# Patient Record
Sex: Male | Born: 1955 | Race: Black or African American | Hispanic: No | Marital: Single | Smoking: Current every day smoker
Health system: Southern US, Community
[De-identification: ages and names within clinical notes are randomized; demographics above are authoritative.]

## PROBLEM LIST (undated history)

## (undated) HISTORY — PX: HEMORRHOID SURGERY: SHX153

---

## 2008-04-03 ENCOUNTER — Emergency Department (HOSPITAL_COMMUNITY): Admission: EM | Admit: 2008-04-03 | Discharge: 2008-04-03 | Payer: Self-pay | Admitting: Emergency Medicine

## 2009-01-06 ENCOUNTER — Emergency Department (HOSPITAL_COMMUNITY): Admission: EM | Admit: 2009-01-06 | Discharge: 2009-01-07 | Payer: Self-pay | Admitting: Emergency Medicine

## 2016-01-07 ENCOUNTER — Inpatient Hospital Stay (HOSPITAL_COMMUNITY)
Admission: EM | Admit: 2016-01-07 | Discharge: 2016-01-09 | DRG: 872 | Disposition: A | Discharge status: Home / Self Care | Payer: Self-pay | Attending: Internal Medicine | Admitting: Internal Medicine

## 2016-01-07 ENCOUNTER — Emergency Department (HOSPITAL_COMMUNITY): Payer: Self-pay

## 2016-01-07 ENCOUNTER — Encounter (HOSPITAL_COMMUNITY): Payer: Self-pay | Admitting: Emergency Medicine

## 2016-01-07 DIAGNOSIS — N390 Urinary tract infection, site not specified: Secondary | ICD-10-CM | POA: Diagnosis present

## 2016-01-07 DIAGNOSIS — F1010 Alcohol abuse, uncomplicated: Secondary | ICD-10-CM | POA: Diagnosis present

## 2016-01-07 DIAGNOSIS — B9620 Unspecified Escherichia coli [E. coli] as the cause of diseases classified elsewhere: Secondary | ICD-10-CM | POA: Diagnosis present

## 2016-01-07 DIAGNOSIS — F17210 Nicotine dependence, cigarettes, uncomplicated: Secondary | ICD-10-CM | POA: Diagnosis present

## 2016-01-07 DIAGNOSIS — E861 Hypovolemia: Secondary | ICD-10-CM | POA: Diagnosis present

## 2016-01-07 DIAGNOSIS — E871 Hypo-osmolality and hyponatremia: Secondary | ICD-10-CM | POA: Diagnosis present

## 2016-01-07 DIAGNOSIS — A419 Sepsis, unspecified organism: Principal | ICD-10-CM | POA: Diagnosis present

## 2016-01-07 LAB — CBC WITH DIFFERENTIAL/PLATELET
BASOS ABS: 0 10*3/uL (ref 0.0–0.1)
Basophils Relative: 1 %
EOS ABS: 0 10*3/uL (ref 0.0–0.7)
EOS PCT: 0 %
HCT: 45.3 % (ref 39.0–52.0)
Hemoglobin: 16.1 g/dL (ref 13.0–17.0)
LYMPHS ABS: 0.5 10*3/uL — AB (ref 0.7–4.0)
LYMPHS PCT: 13 %
MCH: 33 pg (ref 26.0–34.0)
MCHC: 35.5 g/dL (ref 30.0–36.0)
MCV: 92.8 fL (ref 78.0–100.0)
MONO ABS: 0.1 10*3/uL (ref 0.1–1.0)
Monocytes Relative: 2 %
Neutro Abs: 3.4 10*3/uL (ref 1.7–7.7)
Neutrophils Relative %: 84 %
PLATELETS: 115 10*3/uL — AB (ref 150–400)
RBC: 4.88 MIL/uL (ref 4.22–5.81)
RDW: 13 % (ref 11.5–15.5)
WBC: 4.1 10*3/uL (ref 4.0–10.5)

## 2016-01-07 LAB — I-STAT CG4 LACTIC ACID, ED
LACTIC ACID, VENOUS: 1.63 mmol/L (ref 0.5–1.9)
Lactic Acid, Venous: 3.26 mmol/L (ref 0.5–1.9)

## 2016-01-07 LAB — URINE MICROSCOPIC-ADD ON

## 2016-01-07 LAB — COMPREHENSIVE METABOLIC PANEL
ALT: 15 U/L — ABNORMAL LOW (ref 17–63)
AST: 31 U/L (ref 15–41)
Albumin: 3.6 g/dL (ref 3.5–5.0)
Alkaline Phosphatase: 129 U/L — ABNORMAL HIGH (ref 38–126)
Anion gap: 15 (ref 5–15)
BILIRUBIN TOTAL: 0.9 mg/dL (ref 0.3–1.2)
BUN: 9 mg/dL (ref 6–20)
CHLORIDE: 96 mmol/L — AB (ref 101–111)
CO2: 17 mmol/L — ABNORMAL LOW (ref 22–32)
Calcium: 9.2 mg/dL (ref 8.9–10.3)
Creatinine, Ser: 1.02 mg/dL (ref 0.61–1.24)
Glucose, Bld: 93 mg/dL (ref 65–99)
POTASSIUM: 3.7 mmol/L (ref 3.5–5.1)
Sodium: 128 mmol/L — ABNORMAL LOW (ref 135–145)
TOTAL PROTEIN: 7.6 g/dL (ref 6.5–8.1)

## 2016-01-07 LAB — URINALYSIS, ROUTINE W REFLEX MICROSCOPIC
Bilirubin Urine: NEGATIVE
GLUCOSE, UA: NEGATIVE mg/dL
KETONES UR: NEGATIVE mg/dL
Nitrite: NEGATIVE
PROTEIN: NEGATIVE mg/dL
Specific Gravity, Urine: 1.009 (ref 1.005–1.030)
pH: 5.5 (ref 5.0–8.0)

## 2016-01-07 LAB — I-STAT BETA HCG BLOOD, ED (MC, WL, AP ONLY)

## 2016-01-07 LAB — CBG MONITORING, ED: Glucose-Capillary: 95 mg/dL (ref 65–99)

## 2016-01-07 LAB — PROCALCITONIN: Procalcitonin: 0.4 ng/mL

## 2016-01-07 MED ORDER — ACETAMINOPHEN 325 MG PO TABS: 650.0000 mg | ORAL_TABLET | Freq: Once | ORAL | Status: DC | PRN

## 2016-01-07 MED ORDER — VANCOMYCIN HCL IN DEXTROSE 750-5 MG/150ML-% IV SOLN
750.0000 mg | Freq: Two times a day (BID) | INTRAVENOUS | Status: DC
  Administered 2016-01-08 – 2016-01-09 (×3): 750 mg via INTRAVENOUS
  Filled 2016-01-07 (×3): qty 150

## 2016-01-07 MED ORDER — PIPERACILLIN-TAZOBACTAM 3.375 G IVPB
3.3750 g | Freq: Three times a day (TID) | INTRAVENOUS | Status: DC
  Administered 2016-01-08 – 2016-01-09 (×4): 3.375 g via INTRAVENOUS
  Filled 2016-01-07 (×4): qty 50

## 2016-01-07 MED ORDER — NICOTINE 21 MG/24HR TD PT24
21.0000 mg | MEDICATED_PATCH | Freq: Every day | TRANSDERMAL | Status: DC
  Administered 2016-01-08 – 2016-01-09 (×2): 21 mg via TRANSDERMAL
  Filled 2016-01-07 (×2): qty 1

## 2016-01-07 MED ORDER — ACETAMINOPHEN 325 MG PO TABS
650.0000 mg | ORAL_TABLET | Freq: Once | ORAL | Status: AC
  Administered 2016-01-07: 650 mg via ORAL
  Filled 2016-01-07: qty 2

## 2016-01-07 MED ORDER — VANCOMYCIN HCL IN DEXTROSE 1-5 GM/200ML-% IV SOLN
1000.0000 mg | Freq: Once | INTRAVENOUS | Status: AC
  Administered 2016-01-07: 1000 mg via INTRAVENOUS
  Filled 2016-01-07: qty 200

## 2016-01-07 MED ORDER — PIPERACILLIN-TAZOBACTAM 3.375 G IVPB 30 MIN
3.3750 g | Freq: Once | INTRAVENOUS | Status: AC
  Administered 2016-01-07: 3.375 g via INTRAVENOUS
  Filled 2016-01-07: qty 50

## 2016-01-07 MED ORDER — SODIUM CHLORIDE 0.9 % IV BOLUS (SEPSIS)
2000.0000 mL | Freq: Once | INTRAVENOUS | Status: AC
  Administered 2016-01-07: 2000 mL via INTRAVENOUS

## 2016-01-07 NOTE — H&P (Signed)
History and Physical    Ethan Donovan XKG:[id number] DOB: 11/23/1955 DOA: 01/07/2016  PCP: No primary care provider on file. Consultants:  None Patient coming from: home - lives with sister; NOK: Tamsen Roers, daughter, [phone]  Chief Complaint: shaking  HPI: Ethan Donovan is a 60 y.o. male with no significant medical history.  Patient reports that he "just started shaking, couldn't stop shaking."  Started about 1700 tonight.  Felt fine earlier in the day but was "sick" earlier in the week.  He had mild shakes and vomiting Wednesday and Thursday, then felt better.  Did not check temp at home.  Slight runny nose on Wednesday, has continued.  No urinary symptoms.   ED Course: Per PA-C [place]: Ethan Donovan is 60 y.o. male presenting with Lightheaded sensation and what sounds like rigors. Only other symptom is mild rhinorrhea. Patient febrile with a temperature of 102, mild tachycardia, patient with no real symptoms aside from rhinorrhea. Overall well-appearing.  Lactic acid is elevated, initiated vancomycin and Zosyn and patient is given 3 L fluid bolus.  Chest x-ray negative, urinalysis is consistent with infection, urine culture is pending. No CVA tenderness to percussion to suggest a pyelonephritis. Repeat abdominal exam is benign. Case discussed with Dr. Ophelia Charter who accepts admission.  Review of Systems: As per HPI; otherwise 10 point review of systems reviewed and negative.   Ambulatory Status:  Ambulates without assistance, recently fractured akle and no longer using crutch  History reviewed. No pertinent past medical history.  Past Surgical History:  Procedure Laterality Date  . HEMORRHOID SURGERY      Social History   Social History  . Marital status: Single    Spouse name: N/A  . Number of children: N/A  . Years of education: N/A   Occupational History  . unemployed    Social History Main Topics  . Smoking status: Current Every Day Smoker    Packs/day:  1.00    Years: 30.00    Types: Cigarettes  . Smokeless tobacco: Never Used  . Alcohol use 3.6 - 7.2 oz/week    6 - 12 Cans of beer per week     Comment: "too much"  . Drug use:     Types: Marijuana     Comment: last use yesterday  . Sexual activity: Not on file   Other Topics Concern  . Not on file   Social History Narrative  . No narrative on file    No Known Allergies  Family History  Problem Relation Age of Onset  . Stevens-Johnson syndrome Mother   . Pulmonary embolism Father     Prior to Admission medications   Not on File    Physical Exam: Vitals:   01/07/16 2141 01/07/16 2155 01/07/16 2230 01/08/16 0017  BP:  116/69 113/71 110/85  Pulse:  95 90 85  Resp:  17 16 16   Temp: 99.5 F (37.5 C)   99.4 F (37.4 C)  TempSrc: Oral   Oral  SpO2:  96% 100% 98%  Weight:    66.8 kg (147 lb 4.3 oz)  Height:    6' (1.829 m)     General: Appears calm and comfortable and is NAD Eyes:  PERRL, EOMI, normal lids, iris ENT:  grossly normal hearing, lips & tongue, mmm Neck:  no LAD, masses or thyromegaly Cardiovascular:  RRR, no m/r/g. No LE edema.  Respiratory:  CTA bilaterally, no w/r/r. Normal respiratory effort. Abdomen:  soft, ntnd, NABS Skin:  no rash or induration seen on  limited exam Musculoskeletal:  grossly normal tone BUE/BLE, good ROM, no bony abnormality Psychiatric:  grossly normal mood and affect, speech fluent and appropriate, AOx3 Neurologic:  CN 2-12 grossly intact, moves all extremities in coordinated fashion, sensation intact  Labs on Admission: I have personally reviewed following labs and imaging studies  CBC:  Recent Labs Lab 01/07/16 1833  WBC 4.1  NEUTROABS 3.4  HGB 16.1  HCT 45.3  MCV 92.8  PLT 115*   Basic Metabolic Panel:  Recent Labs Lab 01/07/16 1833  NA 128*  K 3.7  CL 96*  CO2 17*  GLUCOSE 93  BUN 9  CREATININE 1.02  CALCIUM 9.2   GFR: Estimated Creatinine Clearance: 74.6 mL/min (by C-G formula based on SCr of  1.02 mg/dL). Liver Function Tests:  Recent Labs Lab 01/07/16 1833  AST 31  ALT 15*  ALKPHOS 129*  BILITOT 0.9  PROT 7.6  ALBUMIN 3.6   No results for input(s): LIPASE, AMYLASE in the last 168 hours. No results for input(s): AMMONIA in the last 168 hours. Coagulation Profile: No results for input(s): INR, PROTIME in the last 168 hours. Cardiac Enzymes: No results for input(s): CKTOTAL, CKMB, CKMBINDEX, TROPONINI in the last 168 hours. BNP (last 3 results) No results for input(s): PROBNP in the last 8760 hours. HbA1C: No results for input(s): HGBA1C in the last 72 hours. CBG:  Recent Labs Lab 01/07/16 1841  GLUCAP 95   Lipid Profile: No results for input(s): CHOL, HDL, LDLCALC, TRIG, CHOLHDL, LDLDIRECT in the last 72 hours. Thyroid Function Tests: No results for input(s): TSH, T4TOTAL, FREET4, T3FREE, THYROIDAB in the last 72 hours. Anemia Panel: No results for input(s): VITAMINB12, FOLATE, FERRITIN, TIBC, IRON, RETICCTPCT in the last 72 hours. Urine analysis:    Component Value Date/Time   COLORURINE YELLOW 01/07/2016 2042   APPEARANCEUR CLOUDY (A) 01/07/2016 2042   LABSPEC 1.009 01/07/2016 2042   PHURINE 5.5 01/07/2016 2042   GLUCOSEU NEGATIVE 01/07/2016 2042   HGBUR MODERATE (A) 01/07/2016 2042   BILIRUBINUR NEGATIVE 01/07/2016 2042   KETONESUR NEGATIVE 01/07/2016 2042   PROTEINUR NEGATIVE 01/07/2016 2042   NITRITE NEGATIVE 01/07/2016 2042   LEUKOCYTESUR LARGE (A) 01/07/2016 2042    Creatinine Clearance: Estimated Creatinine Clearance: 74.6 mL/min (by C-G formula based on SCr of 1.02 mg/dL).  Sepsis Labs: @LABRCNTIP (procalcitonin:4,lacticidven:4) )No results found for this or any previous visit (from the past 240 hour(s)).   Radiological Exams on Admission: Dg Chest 2 View  Result Date: 01/07/2016 CLINICAL DATA:  Dizziness after moving furniture at 10 a.m. this morning. Chills started at 5:30 p.m. EXAM: CHEST  2 VIEW COMPARISON:  None. FINDINGS: Lungs are  hyperexpanded consistent with emphysema. No dense focal airspace consolidation. No pulmonary edema or pleural effusion. Streaky opacity in the right base is likely atelectatic or related to scarring. The cardiopericardial silhouette is within normal limits for size. Anterior right first rib not well visualize, likely positional. Telemetry leads overlie the chest. IMPRESSION: Emphysema with probable atelectasis or scarring at the right base. Electronically Signed   By: Kennith Center M.D.   On: 01/07/2016 19:46    EKG: Independently reviewed.  Sinus tachycardia with rate 109; nonspecific ST changes with no evidence of acute ischemia (computer interpretation included "ST elevation" but EKG was reviewed with Dr. Virgina Organ who agrees that there is no concern for STEMI in this patient)  Assessment/Plan Principal Problem:   Sepsis secondary to UTI [facility]) Active Problems:   Hyponatremia   Sepsis, likely urinary source -Fever, tachycardia with  elevated lactate to 3.26  -Sepsis protocol initiated -Suspect urinary source - large LE, manay bacteria, TNTC WBCs -Blood and urine cultures pending -Will place in observation status and continue to monitor -Treat with IV Rocephin -Will trend lactate to ensure improvement -Despite concern for sepsis, patient appears clinically quite well -His rigors may indicate a gram negative bacteremia, as this is a common presentation  Hyponatremia -Likely hypovolemic related to sepsis -Will follow  DVT prophylaxis: Lovenox Code Status:  Full - confirmed with patient/family Family Communication: Several family members present at bedside  Disposition Plan:  Home once clinically improved Consults called: None  Admission status: Observation    Jonah BlueJennifer Sharyl Panchal MD [facility]  If 7PM-7AM, please contact night-coverage [email or web address] Password [email or web address]  01/08/2016, 1:05 AM

## 2016-01-07 NOTE — ED Triage Notes (Signed)
Pt complaint of dizziness post moving furniture 1000 today. Pt continues to report chills onset 1730 today. Pt denies other symptoms or pain.

## 2016-01-07 NOTE — Progress Notes (Signed)
Pharmacy Antibiotic Follow-up Note  Lillette BoxerBobby Miltner is a 60 y.o. year-old male admitted on 01/07/2016.  The patient is currently on day 1 of Vancomycin & Zosyn for rule out sepsis.  Assessment/Plan: Vancomycin 1gm x1, then 750mg   IV every 12 hours.  Goal trough 15-20 mcg/mL. Zosyn 3.375g IV q8h (4 hour infusion).  Temp (24hrs), Avg:102.8 F (39.3 C), Min:102.8 F (39.3 C), Max:102.8 F (39.3 C)  No results for input(s): WBC in the last 168 hours.  Invalid input(s):  CREATININE  Recent Labs Lab 01/07/16 1833  CREATININE 1.02   Estimated Creatinine Clearance: 70.9 mL/min (by C-G formula based on SCr of 1.02 mg/dL).    No Known Allergies  Antimicrobials this admission: 9/8 Vancomycin  >>  9/8 Zosyn >>   Levels/dose changes this admission:  Microbiology results: 9/8 BCx: sent   Thank you for allowing pharmacy to be a part of this patient's care.  Otho BellowsGreen, Tishawn Friedhoff L PharmD Pager [phone] 01/07/2016, 7:44 PM

## 2016-01-07 NOTE — ED Provider Notes (Signed)
[facility]-EMERGENCY DEPT Provider Note   CSN: [id number] Arrival date & time: 01/07/16  1757     History   Chief Complaint Chief Complaint  Patient presents with  . Fever  . Dizziness    HPI  Blood pressure 120/83, pulse 111, temperature 102.8 F (39.3 C), temperature source Oral, resp. rate 18, height 6' (1.829 m), weight 63.5 kg, SpO2 95 %.  Ethan Donovan is a 60 y.o. male with no significant past medical history, not followed by by primary care complaining of lightheadedness onset this afternoon is patient was cleaning up the yard and power washing the driveway. He felt dizzy and had to sit down, he also had an episode of shaking, states he couldn't drink his beer because he was shaking. States he drinks beer daily, "whenever he can get it" He's never had any DTs or seizures from alcohol withdrawal. He denies cough, shortness of breath, chest pain, abdominal pain, nausea, vomiting, change in bowel or bladder habits, headache, sore throat, stiff neck, tick bites, muscle aches, rash, Flank pain, dysuria, hematuria, testicular pain, swelling, pain with defecation.. On review of systems he endorses rhinorrhea.  HPI  History reviewed. No pertinent past medical history.  Patient Active Problem List   Diagnosis Date Noted  . Sepsis secondary to UTI (HCC) 01/07/2016    Past Surgical History:  Procedure Laterality Date  . ANKLE SURGERY         Home Medications    Prior to Admission medications   Not on File    Family History No family history on file.  Social History Social History  Substance Use Topics  . Smoking status: Current Every Day Smoker    Packs/day: 1.00    Types: Cigarettes  . Smokeless tobacco: Not on file  . Alcohol use Yes     Allergies   Review of patient's allergies indicates no known allergies.   Review of Systems Review of Systems  10 systems reviewed and found to be negative, except as noted in the HPI.   Physical Exam Updated Vital  Signs BP 116/69   Pulse 97   Temp 99.5 F (37.5 C) (Oral)   Resp 16   Ht 6' (1.829 m)   Wt 63.5 kg   SpO2 96%   BMI 18.99 kg/m   Physical Exam  Constitutional: He is oriented to person, place, and time. He appears well-developed and well-nourished.  HENT:  Head: Normocephalic and atraumatic.  Mouth/Throat: Oropharynx is clear and moist.  Eyes: Conjunctivae and EOM are normal. Pupils are equal, round, and reactive to light.  No TTP of maxillary or frontal sinuses  No TTP or induration of temporal arteries bilaterally  Neck: Normal range of motion. Neck supple.  FROM to C-spine. Pt can touch chin to chest without discomfort. No TTP of midline cervical spine.   Cardiovascular: Regular rhythm and intact distal pulses.   Mild tachycardia with no murmur  Pulmonary/Chest: Effort normal and breath sounds normal. No respiratory distress. He has no wheezes. He has no rales. He exhibits no tenderness.  Abdominal: Soft. Bowel sounds are normal. He exhibits no distension and no mass. There is no tenderness. There is no rebound and no guarding. No hernia.  Genitourinary:  Genitourinary Comments: No CVA tenderness to percussion  Musculoskeletal: Normal range of motion. He exhibits no edema or tenderness.  Neurological: He is alert and oriented to person, place, and time. No cranial nerve deficit.  II-Visual fields grossly intact. III/IV/VI-Extraocular movements intact.  Pupils reactive bilaterally. V/VII-Smile  symmetric, equal eyebrow raise,  facial sensation intact VIII- Hearing grossly intact IX/X-Normal gag XI-bilateral shoulder shrug XII-midline tongue extension Motor: 5/5 bilaterally with normal tone and bulk Cerebellar: Normal finger-to-nose  and normal heel-to-shin test.   Romberg negative Ambulates with a coordinated gait   Skin: Capillary refill takes less than 2 seconds.  Warm to the touch  Nursing note and vitals reviewed.    ED Treatments / Results  Labs (all labs  ordered are listed, but only abnormal results are displayed) Labs Reviewed  COMPREHENSIVE METABOLIC PANEL - Abnormal; Notable for the following:       Result Value   Sodium 128 (*)    Chloride 96 (*)    CO2 17 (*)    ALT 15 (*)    Alkaline Phosphatase 129 (*)    All other components within normal limits  CBC WITH DIFFERENTIAL/PLATELET - Abnormal; Notable for the following:    Platelets 115 (*)    Lymphs Abs 0.5 (*)    All other components within normal limits  URINALYSIS, ROUTINE W REFLEX MICROSCOPIC (NOT [facility]) - Abnormal; Notable for the following:    APPearance CLOUDY (*)    Hgb urine dipstick MODERATE (*)    Leukocytes, UA LARGE (*)    All other components within normal limits  URINE MICROSCOPIC-ADD ON - Abnormal; Notable for the following:    Squamous Epithelial / LPF 0-5 (*)    Bacteria, UA MANY (*)    All other components within normal limits  I-STAT CG4 LACTIC ACID, ED - Abnormal; Notable for the following:    Lactic Acid, Venous 3.26 (*)    All other components within normal limits  CULTURE, BLOOD (ROUTINE X 2)  CULTURE, BLOOD (ROUTINE X 2)  URINE CULTURE  PROCALCITONIN  I-STAT BETA HCG BLOOD, ED ([facility], [facility], [facility] ONLY)  CBG MONITORING, ED  I-STAT CG4 LACTIC ACID, ED    EKG  EKG Interpretation  Date/Time:  Friday January 07 2016 18:05:17 EDT Ventricular Rate:  125 PR Interval:    QRS Duration: 87 QT Interval:  303 QTC Calculation: 437 R Axis:   -89 Text Interpretation:  Sinus tachycardia Probable left atrial enlargement Left anterior fascicular block Probable left ventricular hypertrophy Anterior Q waves, possibly due to LVH Baseline wander in lead(s) V5 No old tracing to compare Confirmed by Ethelda ChickJACUBOWITZ  MD, SAM [phone]) on 01/07/2016 6:12:34 PM       Radiology Dg Chest 2 View  Result Date: 01/07/2016 CLINICAL DATA:  Dizziness after moving furniture at 10 a.m. this morning. Chills started at 5:30 p.m. EXAM: CHEST  2 VIEW COMPARISON:  None. FINDINGS: Lungs are  hyperexpanded consistent with emphysema. No dense focal airspace consolidation. No pulmonary edema or pleural effusion. Streaky opacity in the right base is likely atelectatic or related to scarring. The cardiopericardial silhouette is within normal limits for size. Anterior right first rib not well visualize, likely positional. Telemetry leads overlie the chest. IMPRESSION: Emphysema with probable atelectasis or scarring at the right base. Electronically Signed   By: Kennith CenterEric  Mansell M.D.   On: 01/07/2016 19:46    Procedures Procedures (including critical care time)  Medications Ordered in ED Medications  acetaminophen (TYLENOL) tablet 650 mg (not administered)  vancomycin (VANCOCIN) IVPB 1000 mg/200 mL premix (1,000 mg Intravenous New Bag/Given 01/07/16 2134)  vancomycin (VANCOCIN) IVPB 750 mg/150 ml premix (not administered)  piperacillin-tazobactam (ZOSYN) IVPB 3.375 g (not administered)  acetaminophen (TYLENOL) tablet 650 mg (650 mg Oral Given 01/07/16 1856)  sodium chloride  0.9 % bolus 2,000 mL (0 mLs Intravenous Stopped 01/07/16 2120)  piperacillin-tazobactam (ZOSYN) IVPB 3.375 g (0 g Intravenous Stopped 01/07/16 2141)     Initial Impression / Assessment and Plan / ED Course  I have reviewed the triage vital signs and the nursing notes.  Pertinent labs & imaging results that were available during my care of the patient were reviewed by me and considered in my medical decision making (see chart for details).  Clinical Course    Vitals:   01/07/16 2100 01/07/16 2111 01/07/16 2130 01/07/16 2141  BP: 122/73 122/70 116/69   Pulse: 92 94 97   Resp:  16    Temp:    99.5 F (37.5 C)  TempSrc:    Oral  SpO2: 100% 100% 96%   Weight:      Height:        Medications  acetaminophen (TYLENOL) tablet 650 mg (not administered)  vancomycin (VANCOCIN) IVPB 1000 mg/200 mL premix (1,000 mg Intravenous New Bag/Given 01/07/16 2134)  vancomycin (VANCOCIN) IVPB 750 mg/150 ml premix (not administered)    piperacillin-tazobactam (ZOSYN) IVPB 3.375 g (not administered)  acetaminophen (TYLENOL) tablet 650 mg (650 mg Oral Given 01/07/16 1856)  sodium chloride 0.9 % bolus 2,000 mL (0 mLs Intravenous Stopped 01/07/16 2120)  piperacillin-tazobactam (ZOSYN) IVPB 3.375 g (0 g Intravenous Stopped 01/07/16 2141)    Ethan Donovan is 60 y.o. male presenting with Lightheaded sensation and what sounds like rigors. Only other symptom is mild rhinorrhea. Patient febrile with a temperature of 102, mild tachycardia, patient with no real symptoms aside from rhinorrhea. Overall well-appearing.  Lactic acid is elevated, initiated vancomycin and Zosyn and patient is given 3 L fluid bolus.  Chest x-ray negative, urinalysis is consistent with infection, urine culture is pending. No CVA tenderness to percussion to suggest a pyelonephritis. Repeat abdominal exam is benign. Case discussed with Dr. Ophelia Charter who accepts admission.      Final Clinical Impressions(s) / ED Diagnoses   Final diagnoses:  Sepsis secondary to UTI [facility])      Wynetta Emery, PA-C 01/07/16 2152    Lavera Guise, MD 01/08/16 [phone]

## 2016-01-07 NOTE — ED Notes (Signed)
PA given lactic results.

## 2016-01-07 NOTE — ED Notes (Signed)
Patient comes from home with fever that began this afternoon.  Patient felt unwell on Tuesday of this week, with N/V, but that resolved spontaneously.  On exam, patient's lung sounds are CTAB.  Heart sounds S1/S2.  +2 radial and pedal pulses.  No pre-tibial and pedal edema.  Patient's abdomen is soft and non-tender to palpation.  Bowel sounds present.  Patient is alert and oriented to person, place, time and events. Skin warm/dry.

## 2016-01-08 DIAGNOSIS — E871 Hypo-osmolality and hyponatremia: Secondary | ICD-10-CM | POA: Diagnosis present

## 2016-01-08 DIAGNOSIS — A419 Sepsis, unspecified organism: Principal | ICD-10-CM

## 2016-01-08 DIAGNOSIS — N390 Urinary tract infection, site not specified: Secondary | ICD-10-CM

## 2016-01-08 LAB — PROTIME-INR
INR: 0.95
Prothrombin Time: 12.7 seconds (ref 11.4–15.2)

## 2016-01-08 LAB — LACTIC ACID, PLASMA
LACTIC ACID, VENOUS: 0.7 mmol/L (ref 0.5–1.9)
Lactic Acid, Venous: 1.3 mmol/L (ref 0.5–1.9)

## 2016-01-08 LAB — APTT: aPTT: 30 seconds (ref 24–36)

## 2016-01-08 MED ORDER — ADULT MULTIVITAMIN W/MINERALS CH
1.0000 | ORAL_TABLET | Freq: Every day | ORAL | Status: DC
  Administered 2016-01-08 – 2016-01-09 (×2): 1 via ORAL
  Filled 2016-01-08 (×2): qty 1

## 2016-01-08 MED ORDER — LORAZEPAM 1 MG PO TABS
1.0000 mg | ORAL_TABLET | Freq: Four times a day (QID) | ORAL | Status: DC | PRN
Start: 2016-01-08 — End: 2016-01-09

## 2016-01-08 MED ORDER — LORAZEPAM 2 MG/ML IJ SOLN: 1.0000 mg | Freq: Four times a day (QID) | INTRAMUSCULAR | Status: DC | PRN

## 2016-01-08 MED ORDER — THIAMINE HCL 100 MG/ML IJ SOLN: 100.0000 mg | Freq: Every day | INTRAMUSCULAR | Status: DC

## 2016-01-08 MED ORDER — FOLIC ACID 1 MG PO TABS
1.0000 mg | ORAL_TABLET | Freq: Every day | ORAL | Status: DC
  Administered 2016-01-08 – 2016-01-09 (×2): 1 mg via ORAL
  Filled 2016-01-08 (×2): qty 1

## 2016-01-08 MED ORDER — VITAMIN B-1 100 MG PO TABS
100.0000 mg | ORAL_TABLET | Freq: Every day | ORAL | Status: DC
  Administered 2016-01-08 – 2016-01-09 (×2): 100 mg via ORAL
  Filled 2016-01-08 (×2): qty 1

## 2016-01-08 NOTE — Progress Notes (Signed)
PROGRESS NOTE    Ethan Donovan  RUE:[id number] DOB: 1955/06/24 DOA: 01/07/2016 PCP: No primary care provider on file.   Outpatient Specialists:     Brief Narrative:     Assessment & Plan:   Principal Problem:   Sepsis secondary to UTI [facility]) Active Problems:   Hyponatremia   Sepsis, likely urinary source -Fever, tachycardia with elevated lactate to 3.26  -Sepsis protocol initiated -Suspect urinary source - large LE, manay bacteria, TNTC WBCs -Blood and urine cultures pending -on IV zosyn -Will trend lactate to ensure improvement -Despite concern for sepsis, patient appears clinically quite well  -much improved this AM  Hyponatremia -Likely hypovolemic related to sepsis -Will follow    DVT prophylaxis:  SCD's  Code Status: Full Code   Family Communication: patient  Disposition Plan:  Home 1-2 days   Consultants:     Procedures:        Subjective: Feeling much better-- asking about going home  Objective: Vitals:   01/07/16 2230 01/08/16 0017 01/08/16 0626 01/08/16 1303  BP: 113/71 110/85 120/88 (!) 149/92  Pulse: 90 85 75 72  Resp: 16 16 18 16   Temp:  99.4 F (37.4 C) 99.2 F (37.3 C) 99.1 F (37.3 C)  TempSrc:  Oral Oral Oral  SpO2: 100% 98% 99% 99%  Weight:  66.8 kg (147 lb 4.3 oz)    Height:  6' (1.829 m)      Intake/Output Summary (Last 24 hours) at 01/08/16 1323 Last data filed at 01/08/16 1303  Gross per 24 hour  Intake              875 ml  Output                0 ml  Net              875 ml   Filed Weights   01/07/16 1808 01/08/16 0017  Weight: 63.5 kg (140 lb) 66.8 kg (147 lb 4.3 oz)    Examination:  General exam: Appears calm and comfortable  Respiratory system: Clear to auscultation. Respiratory effort normal. Cardiovascular system: S1 & S2 heard, RRR. No JVD, murmurs, rubs, gallops or clicks. No pedal edema. Gastrointestinal system: Abdomen is nondistended, soft and nontender. No organomegaly or masses felt.  Normal bowel sounds heard. Central nervous system: Alert and oriented. No focal neurological deficits. Extremities: Symmetric 5 x 5 power. Skin: No rashes, lesions or ulcers Psychiatry: Judgement and insight appear normal. Mood & affect appropriate.     Data Reviewed: I have personally reviewed following labs and imaging studies  CBC:  Recent Labs Lab 01/07/16 1833  WBC 4.1  NEUTROABS 3.4  HGB 16.1  HCT 45.3  MCV 92.8  PLT 115*   Basic Metabolic Panel:  Recent Labs Lab 01/07/16 1833  NA 128*  K 3.7  CL 96*  CO2 17*  GLUCOSE 93  BUN 9  CREATININE 1.02  CALCIUM 9.2   GFR: Estimated Creatinine Clearance: 74.6 mL/min (by C-G formula based on SCr of 1.02 mg/dL). Liver Function Tests:  Recent Labs Lab 01/07/16 1833  AST 31  ALT 15*  ALKPHOS 129*  BILITOT 0.9  PROT 7.6  ALBUMIN 3.6   No results for input(s): LIPASE, AMYLASE in the last 168 hours. No results for input(s): AMMONIA in the last 168 hours. Coagulation Profile:  Recent Labs Lab 01/08/16 0754  INR 0.95   Cardiac Enzymes: No results for input(s): CKTOTAL, CKMB, CKMBINDEX, TROPONINI in the last 168 hours. BNP (last 3  results) No results for input(s): PROBNP in the last 8760 hours. HbA1C: No results for input(s): HGBA1C in the last 72 hours. CBG:  Recent Labs Lab 01/07/16 1841  GLUCAP 95   Lipid Profile: No results for input(s): CHOL, HDL, LDLCALC, TRIG, CHOLHDL, LDLDIRECT in the last 72 hours. Thyroid Function Tests: No results for input(s): TSH, T4TOTAL, FREET4, T3FREE, THYROIDAB in the last 72 hours. Anemia Panel: No results for input(s): VITAMINB12, FOLATE, FERRITIN, TIBC, IRON, RETICCTPCT in the last 72 hours. Urine analysis:    Component Value Date/Time   COLORURINE YELLOW 01/07/2016 2042   APPEARANCEUR CLOUDY (A) 01/07/2016 2042   LABSPEC 1.009 01/07/2016 2042   PHURINE 5.5 01/07/2016 2042   GLUCOSEU NEGATIVE 01/07/2016 2042   HGBUR MODERATE (A) 01/07/2016 2042    BILIRUBINUR NEGATIVE 01/07/2016 2042   KETONESUR NEGATIVE 01/07/2016 2042   PROTEINUR NEGATIVE 01/07/2016 2042   NITRITE NEGATIVE 01/07/2016 2042   LEUKOCYTESUR LARGE (A) 01/07/2016 2042     ) Recent Results (from the past 240 hour(s))  Culture, blood (Routine x 2)     Status: None (Preliminary result)   Collection Time: 01/07/16  6:19 PM  Result Value Ref Range Status   Specimen Description BLOOD BLOOD RIGHT ARM  Final   Special Requests BOTTLES DRAWN AEROBIC AND ANAEROBIC 5CC  Final   Culture   Final    NO GROWTH < 12 HOURS Performed at [facility]    Report Status PENDING  Incomplete  Culture, blood (Routine x 2)     Status: None (Preliminary result)   Collection Time: 01/07/16  6:38 PM  Result Value Ref Range Status   Specimen Description BLOOD BLOOD RIGHT ARM  Final   Special Requests BOTTLES DRAWN AEROBIC AND ANAEROBIC 5CC  Final   Culture   Final    NO GROWTH < 12 HOURS Performed at [facility]    Report Status PENDING  Incomplete      Anti-infectives    Start     Dose/Rate Route Frequency Ordered Stop   01/08/16 0400  vancomycin (VANCOCIN) IVPB 750 mg/150 ml premix     750 mg 150 mL/hr over 60 Minutes Intravenous Every 12 hours 01/07/16 1952     01/08/16 0400  piperacillin-tazobactam (ZOSYN) IVPB 3.375 g     3.375 g 12.5 mL/hr over 240 Minutes Intravenous Every 8 hours 01/07/16 1954     01/07/16 1945  piperacillin-tazobactam (ZOSYN) IVPB 3.375 g     3.375 g 100 mL/hr over 30 Minutes Intravenous  Once 01/07/16 1941 01/07/16 2141   01/07/16 1945  vancomycin (VANCOCIN) IVPB 1000 mg/200 mL premix     1,000 mg 200 mL/hr over 60 Minutes Intravenous  Once 01/07/16 1941 01/07/16 2234       Radiology Studies: Dg Chest 2 View  Result Date: 01/07/2016 CLINICAL DATA:  Dizziness after moving furniture at 10 a.m. this morning. Chills started at 5:30 p.m. EXAM: CHEST  2 VIEW COMPARISON:  None. FINDINGS: Lungs are hyperexpanded consistent with emphysema.  No dense focal airspace consolidation. No pulmonary edema or pleural effusion. Streaky opacity in the right base is likely atelectatic or related to scarring. The cardiopericardial silhouette is within normal limits for size. Anterior right first rib not well visualize, likely positional. Telemetry leads overlie the chest. IMPRESSION: Emphysema with probable atelectasis or scarring at the right base. Electronically Signed   By: Kennith CenterEric  Mansell M.D.   On: 01/07/2016 19:46        Scheduled Meds: . folic acid  1 mg Oral Daily  . multivitamin with minerals  1 tablet Oral Daily  . nicotine  21 mg Transdermal Daily  . piperacillin-tazobactam (ZOSYN)  IV  3.375 g Intravenous Q8H  . thiamine  100 mg Oral Daily   Or  . thiamine  100 mg Intravenous Daily  . vancomycin  750 mg Intravenous Q12H   Continuous Infusions:    LOS: 0 days    Time spent: 25 min    JESSICA Juanetta Gosling, DO [facility] Pager [phone]  If 7PM-7AM, please contact night-coverage [email or web address] Password [email or web address] 01/08/2016, 1:23 PM

## 2016-01-09 DIAGNOSIS — F1010 Alcohol abuse, uncomplicated: Secondary | ICD-10-CM

## 2016-01-09 LAB — CBC
HCT: 43 % (ref 39.0–52.0)
HEMOGLOBIN: 14.8 g/dL (ref 13.0–17.0)
MCH: 32.2 pg (ref 26.0–34.0)
MCHC: 34.4 g/dL (ref 30.0–36.0)
MCV: 93.7 fL (ref 78.0–100.0)
Platelets: 146 10*3/uL — ABNORMAL LOW (ref 150–400)
RBC: 4.59 MIL/uL (ref 4.22–5.81)
RDW: 13.4 % (ref 11.5–15.5)
WBC: 8.2 10*3/uL (ref 4.0–10.5)

## 2016-01-09 LAB — BASIC METABOLIC PANEL
Anion gap: 6 (ref 5–15)
BUN: 7 mg/dL (ref 6–20)
CHLORIDE: 106 mmol/L (ref 101–111)
CO2: 25 mmol/L (ref 22–32)
CREATININE: 0.98 mg/dL (ref 0.61–1.24)
Calcium: 8.9 mg/dL (ref 8.9–10.3)
GFR calc Af Amer: >60 mL/min (ref >60)
GFR calc non Af Amer: >60 mL/min (ref >60)
GLUCOSE: 123 mg/dL — AB (ref 65–99)
POTASSIUM: 3.4 mmol/L — AB (ref 3.5–5.1)
SODIUM: 137 mmol/L (ref 135–145)

## 2016-01-09 MED ORDER — NICOTINE 21 MG/24HR TD PT24: 21.0000 mg | MEDICATED_PATCH | Freq: Every day | TRANSDERMAL | 0 refills | Status: AC

## 2016-01-09 MED ORDER — CIPROFLOXACIN HCL 500 MG PO TABS
500.0000 mg | ORAL_TABLET | Freq: Two times a day (BID) | ORAL | Status: DC
  Administered 2016-01-09: 500 mg via ORAL
  Filled 2016-01-09: qty 1

## 2016-01-09 MED ORDER — CIPROFLOXACIN HCL 500 MG PO TABS: 500.0000 mg | ORAL_TABLET | Freq: Two times a day (BID) | ORAL | 0 refills | Status: DC

## 2016-01-09 NOTE — Discharge Summary (Signed)
Physician Discharge Summary  Ethan Donovan GNF:[id number] DOB: 04-Jan-1956 DOA: 01/07/2016  PCP: No primary care provider on file.- [facility] to [facility]  Admit date: 01/07/2016 Discharge date: 01/09/2016   Recommendations for Outpatient Follow-Up:   1. Establish with PCP 2. Alcohol cessation   Discharge Diagnosis:   Principal Problem:   Sepsis secondary to UTI [facility]) Active Problems:   Hyponatremia   Discharge disposition:  Home  Discharge Condition: Improved.  Diet recommendation: Low sodium, heart healthy..  Wound care: None.   History of Present Illness:   Ethan Donovan is a 60 y.o. male with no significant medical history.  Patient reports that he "just started shaking, couldn't stop shaking."  Started about 1700 tonight.  Felt fine earlier in the day but was "sick" earlier in the week.  He had mild shakes and vomiting Wednesday and Thursday, then felt better.  Did not check temp at home.  Slight runny nose on Wednesday, has continued.  No urinary symptoms.   ED Course: Per PA-C [place]: Joni Austinis 59 y.o.malepresenting with Lightheaded sensation and what sounds like rigors. Only other symptom is mild rhinorrhea. Patient febrile with a temperature of 102, mild tachycardia, patient with no real symptoms aside from rhinorrhea. Overall well-appearing.   Hospital Course by Problem:   Sepsis, from e coli UTI -Fever, tachycardia with elevated lactate to 3.26 -given IV zosyn-- changed to Po cipro as patient improved quicker than anticipated  Alcohol abuse -did not score on CIWA -encouraged cessation  Hyponatremia -Likely hypovolemic related to sepsis -improved with IVF     Medical Consultants:    None.   Discharge Exam:   Vitals:   01/08/16 2139 01/09/16 0501  BP: 140/90 135/90  Pulse: 69 64  Resp: 18 18  Temp: 99.2 F (37.3 C) 98.9 F (37.2 C)   Vitals:   01/08/16 0626 01/08/16 1303 01/08/16 2139 01/09/16 0501    BP: 120/88 (!) 149/92 140/90 135/90  Pulse: 75 72 69 64  Resp: 18 16 18 18   Temp: 99.2 F (37.3 C) 99.1 F (37.3 C) 99.2 F (37.3 C) 98.9 F (37.2 C)  TempSrc: Oral Oral Oral Oral  SpO2: 99% 99% 100% 99%  Weight:      Height:        Gen:  NAD- eating well and anxious to go home    The results of significant diagnostics from this hospitalization (including imaging, microbiology, ancillary and laboratory) are listed below for reference.     Procedures and Diagnostic Studies:   Dg Chest 2 View  Result Date: 01/07/2016 CLINICAL DATA:  Dizziness after moving furniture at 10 a.m. this morning. Chills started at 5:30 p.m. EXAM: CHEST  2 VIEW COMPARISON:  None. FINDINGS: Lungs are hyperexpanded consistent with emphysema. No dense focal airspace consolidation. No pulmonary edema or pleural effusion. Streaky opacity in the right base is likely atelectatic or related to scarring. The cardiopericardial silhouette is within normal limits for size. Anterior right first rib not well visualize, likely positional. Telemetry leads overlie the chest. IMPRESSION: Emphysema with probable atelectasis or scarring at the right base. Electronically Signed   By: Kennith Center M.D.   On: 01/07/2016 19:46     Labs:   Basic Metabolic Panel:  Recent Labs Lab 01/07/16 1833 01/09/16 0407  NA 128* 137  K 3.7 3.4*  CL 96* 106  CO2 17* 25  GLUCOSE 93 123*  BUN 9 7  CREATININE 1.02 0.98  CALCIUM 9.2 8.9   GFR  Estimated Creatinine Clearance: 77.6 mL/min (by C-G formula based on SCr of 0.98 mg/dL). Liver Function Tests:  Recent Labs Lab 01/07/16 1833  AST 31  ALT 15*  ALKPHOS 129*  BILITOT 0.9  PROT 7.6  ALBUMIN 3.6   No results for input(s): LIPASE, AMYLASE in the last 168 hours. No results for input(s): AMMONIA in the last 168 hours. Coagulation profile  Recent Labs Lab 01/08/16 0754  INR 0.95    CBC:  Recent Labs Lab 01/07/16 1833 01/09/16 0407  WBC 4.1 8.2  NEUTROABS 3.4   --   HGB 16.1 14.8  HCT 45.3 43.0  MCV 92.8 93.7  PLT 115* 146*   Cardiac Enzymes: No results for input(s): CKTOTAL, CKMB, CKMBINDEX, TROPONINI in the last 168 hours. BNP: Invalid input(s): POCBNP CBG:  Recent Labs Lab 01/07/16 1841  GLUCAP 95   D-Dimer No results for input(s): DDIMER in the last 72 hours. Hgb A1c No results for input(s): HGBA1C in the last 72 hours. Lipid Profile No results for input(s): CHOL, HDL, LDLCALC, TRIG, CHOLHDL, LDLDIRECT in the last 72 hours. Thyroid function studies No results for input(s): TSH, T4TOTAL, T3FREE, THYROIDAB in the last 72 hours.  Invalid input(s): FREET3 Anemia work up No results for input(s): VITAMINB12, FOLATE, FERRITIN, TIBC, IRON, RETICCTPCT in the last 72 hours. Microbiology Recent Results (from the past 240 hour(s))  Culture, blood (Routine x 2)     Status: None (Preliminary result)   Collection Time: 01/07/16  6:19 PM  Result Value Ref Range Status   Specimen Description BLOOD BLOOD RIGHT ARM  Final   Special Requests BOTTLES DRAWN AEROBIC AND ANAEROBIC 5CC  Final   Culture   Final    NO GROWTH < 12 HOURS Performed at [facility]    Report Status PENDING  Incomplete  Culture, blood (Routine x 2)     Status: None (Preliminary result)   Collection Time: 01/07/16  6:38 PM  Result Value Ref Range Status   Specimen Description BLOOD BLOOD RIGHT ARM  Final   Special Requests BOTTLES DRAWN AEROBIC AND ANAEROBIC 5CC  Final   Culture   Final    NO GROWTH < 12 HOURS Performed at [facility]    Report Status PENDING  Incomplete  Urine culture     Status: Abnormal (Preliminary result)   Collection Time: 01/07/16  8:42 PM  Result Value Ref Range Status   Specimen Description URINE, RANDOM  Final   Special Requests NONE  Final   Culture >=100,000 COLONIES/mL ESCHERICHIA COLI (A)  Final   Report Status PENDING  Incomplete     Discharge Instructions:   Discharge Instructions    Diet - low sodium heart  healthy    Complete by:  As directed   Discharge instructions    Complete by:  As directed   Alcohol cessation advises   Increase activity slowly    Complete by:  As directed       Medication List    TAKE these medications   ciprofloxacin 500 MG tablet Commonly known as:  CIPRO Take 1 tablet (500 mg total) by mouth 2 (two) times daily.   nicotine 21 mg/24hr patch Commonly known as:  NICODERM CQ - dosed in mg/24 hours Place 1 patch (21 mg total) onto the skin daily.         Time coordinating discharge: 35 min  Signed:  Natasha Burda U Tiegan Jambor   [facility] 01/09/2016, 2:33 PM

## 2016-01-09 NOTE — Progress Notes (Addendum)
Patient discharged home, copies of all discharge medications and instructions reviewed and questions answered. Patient to be assisted to vehicle by wheelchair.  

## 2016-01-10 LAB — URINE CULTURE: Culture: >=100000 — AB

## 2016-01-12 LAB — CULTURE, BLOOD (ROUTINE X 2)
CULTURE: NO GROWTH
Culture: NO GROWTH

## 2017-11-24 IMAGING — CR DG CHEST 2V
2 series · 2 of 2 positions shown · non-contrast
Comparison: None.

CLINICAL DATA: Dizziness after moving furniture at 10 a.m. this
morning. Chills started at [DATE] p.m..

EXAM:
CHEST  2 VIEW

[w chest pa]
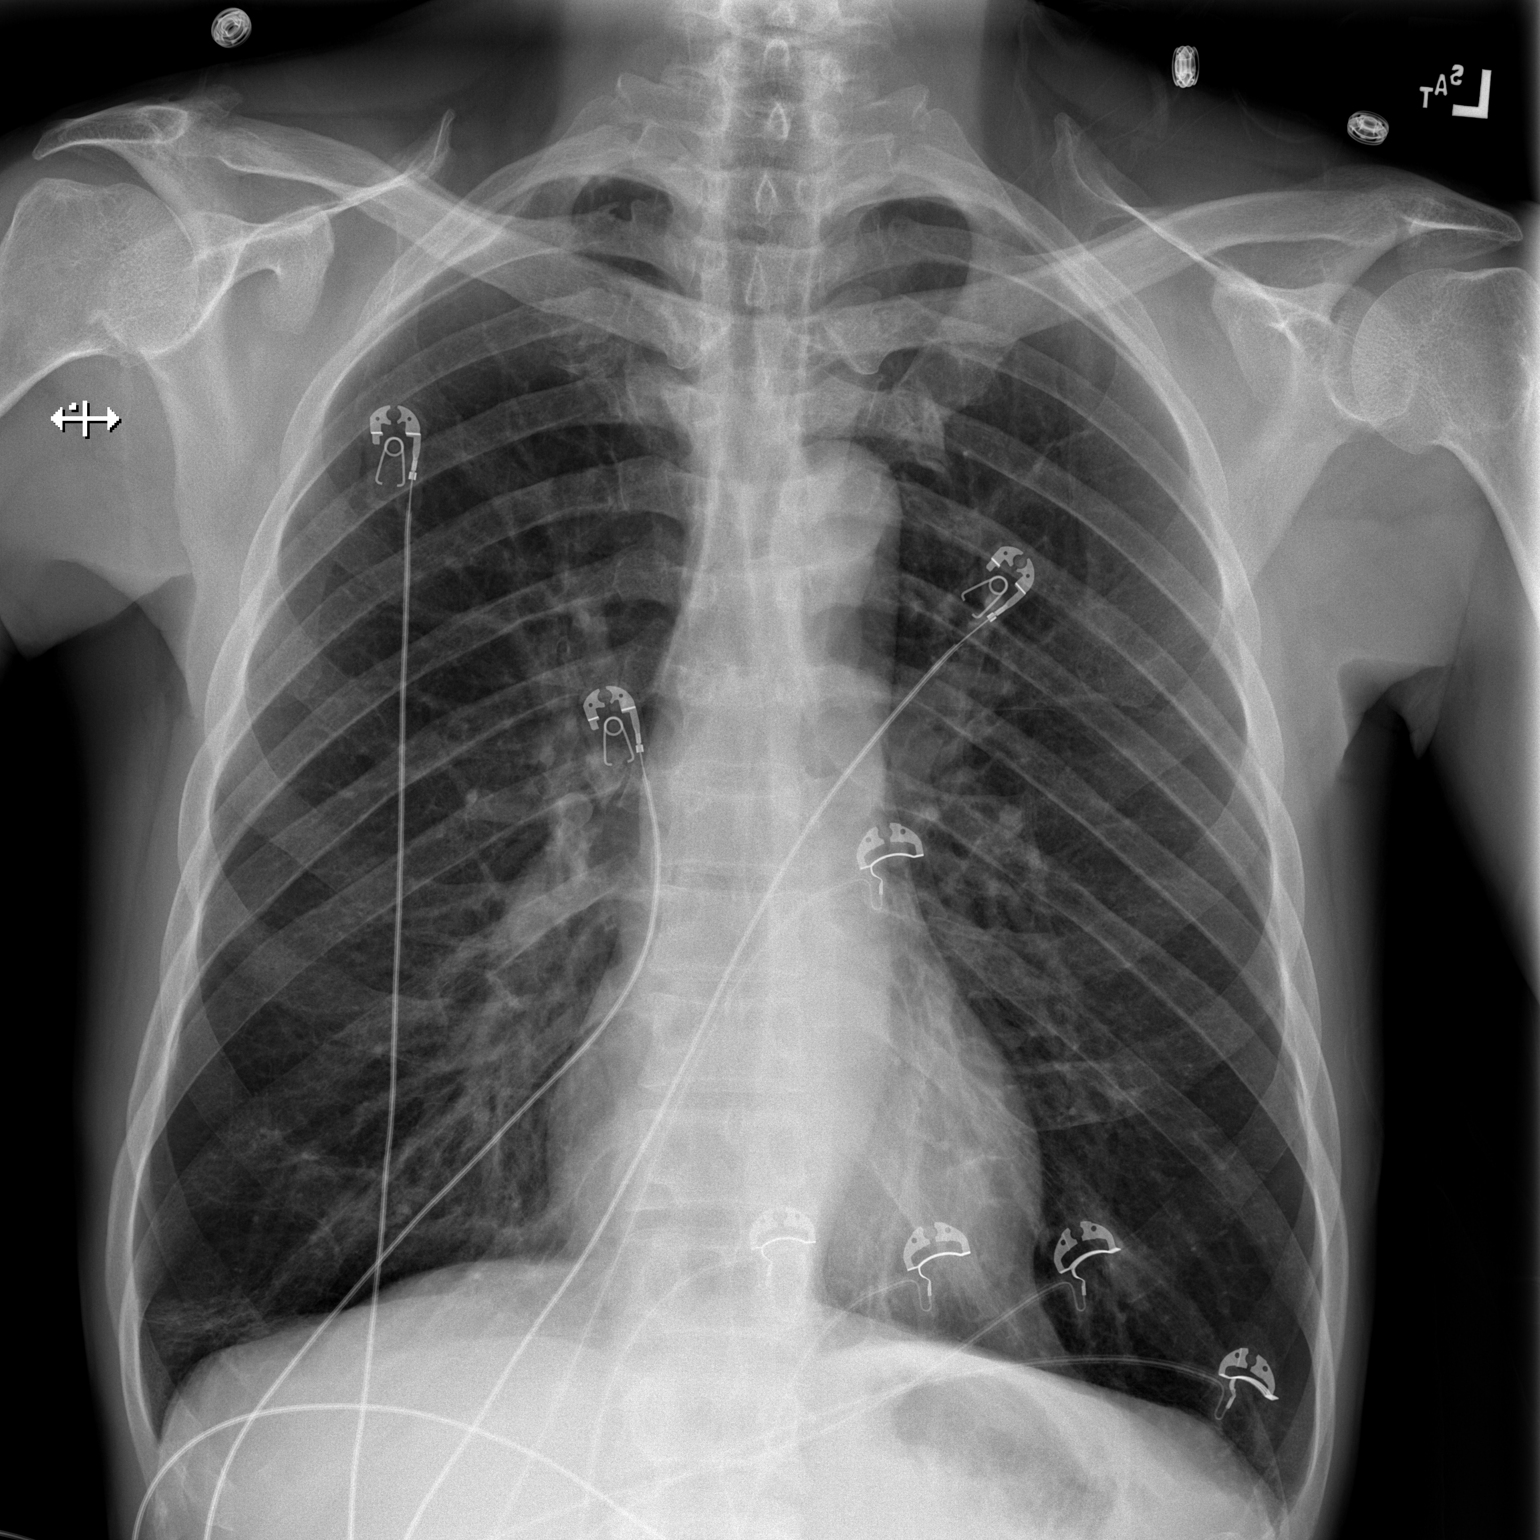

[w chest lat]
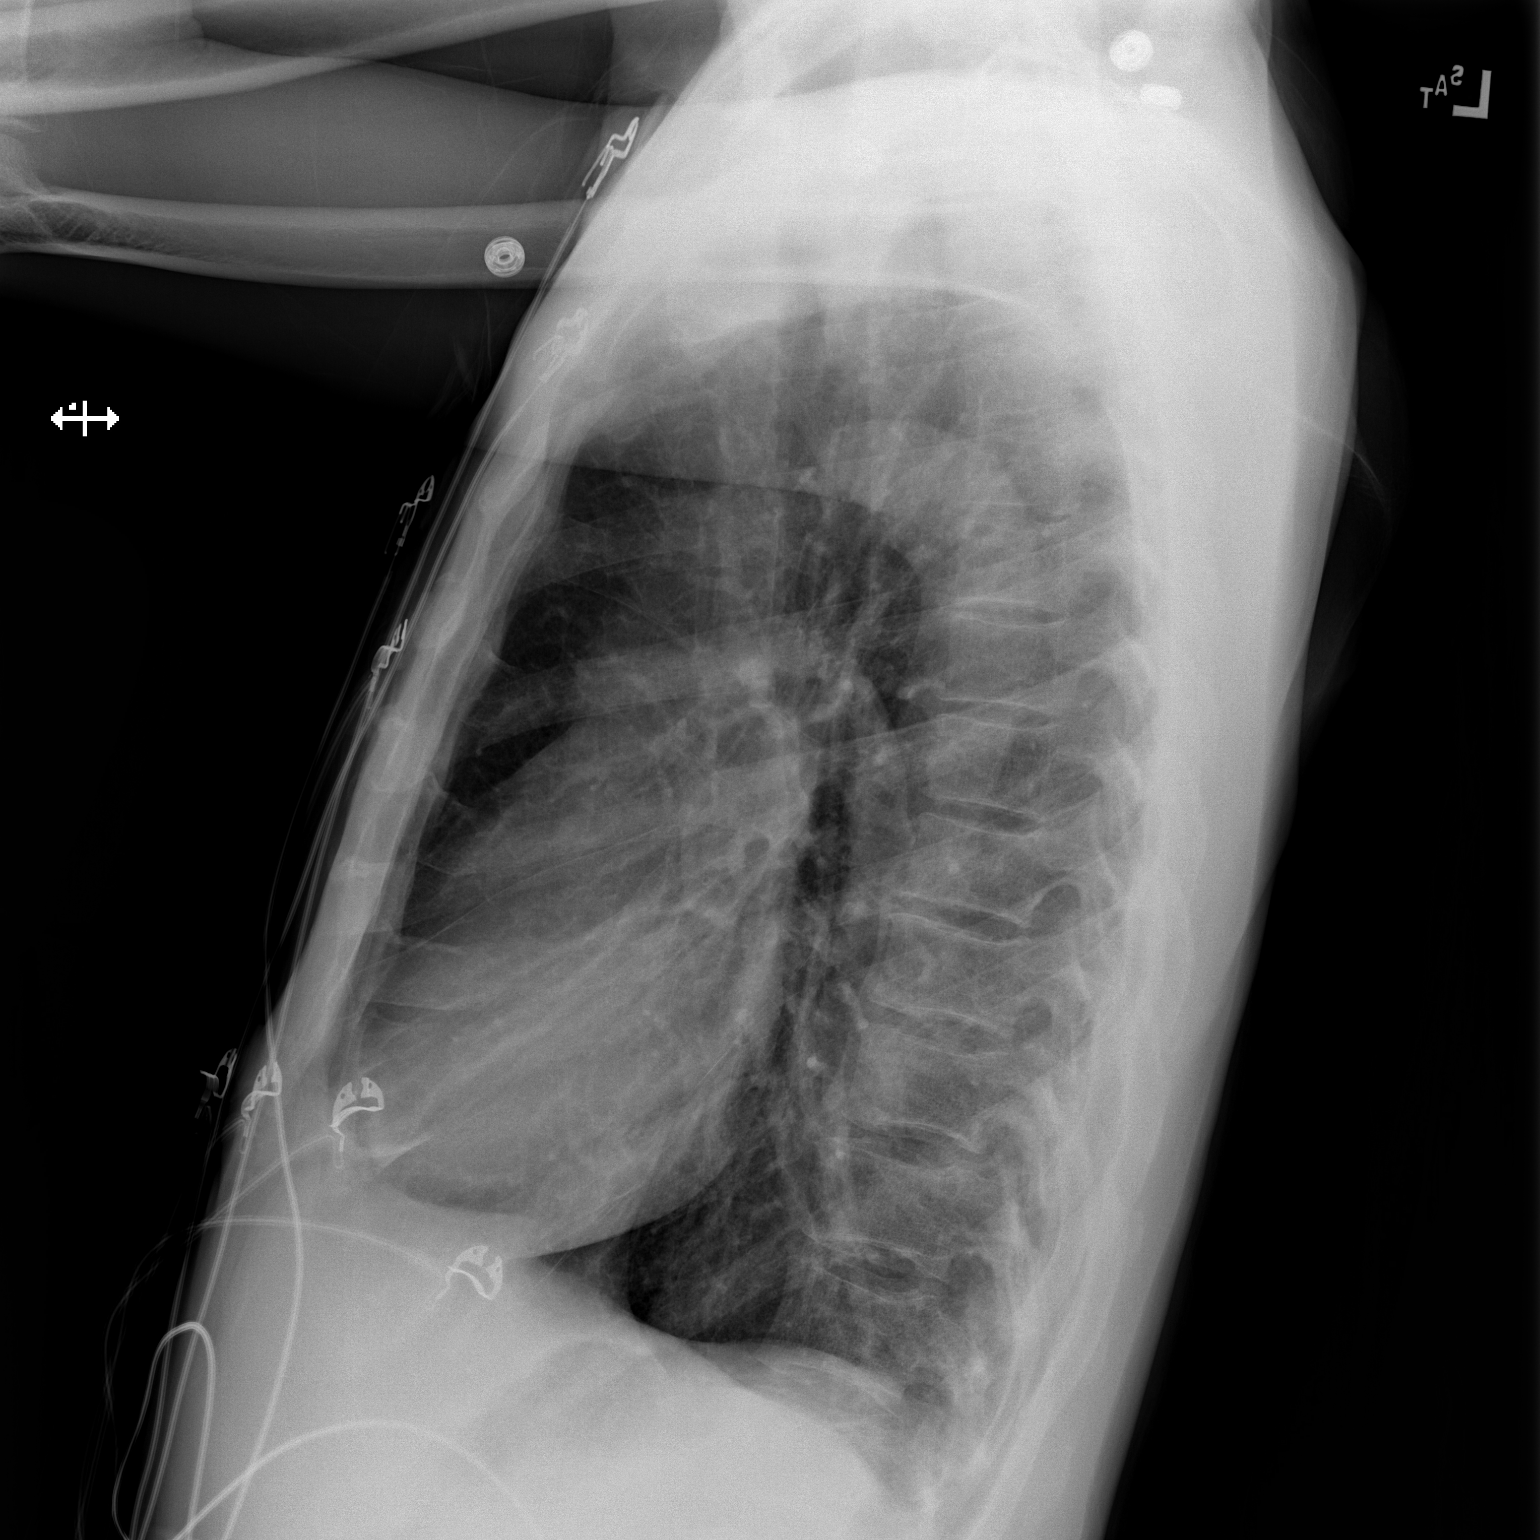

[2 of 2 positions shown; findings below may reference images not displayed]

FINDINGS: Lungs are hyperexpanded consistent with emphysema. No dense focal
airspace consolidation. No pulmonary edema or pleural effusion.
Streaky opacity in the right base is likely atelectatic or related
to scarring. The cardiopericardial silhouette is within normal
limits for size. Anterior right first rib not well visualize, likely
positional. Telemetry leads overlie the chest.
IMPRESSION: Emphysema with probable atelectasis or scarring at the right base.

## 2022-01-31 ENCOUNTER — Emergency Department (HOSPITAL_COMMUNITY)
Admission: EM | Admit: 2022-01-31 | Discharge: 2022-01-31 | Disposition: A | Discharge status: Home / Self Care | Payer: Medicare HMO | Attending: Emergency Medicine | Admitting: Emergency Medicine

## 2022-01-31 ENCOUNTER — Other Ambulatory Visit: Payer: Self-pay

## 2022-01-31 ENCOUNTER — Emergency Department (HOSPITAL_BASED_OUTPATIENT_CLINIC_OR_DEPARTMENT_OTHER): Payer: Medicare HMO

## 2022-01-31 ENCOUNTER — Emergency Department (HOSPITAL_COMMUNITY): Payer: Medicare HMO

## 2022-01-31 DIAGNOSIS — R609 Edema, unspecified: Secondary | ICD-10-CM

## 2022-01-31 DIAGNOSIS — Z7901 Long term (current) use of anticoagulants: Secondary | ICD-10-CM | POA: Diagnosis not present

## 2022-01-31 DIAGNOSIS — I824Z1 Acute embolism and thrombosis of unspecified deep veins of right distal lower extremity: Secondary | ICD-10-CM | POA: Diagnosis not present

## 2022-01-31 DIAGNOSIS — N3000 Acute cystitis without hematuria: Secondary | ICD-10-CM

## 2022-01-31 DIAGNOSIS — M7989 Other specified soft tissue disorders: Secondary | ICD-10-CM | POA: Diagnosis present

## 2022-01-31 DIAGNOSIS — R600 Localized edema: Secondary | ICD-10-CM | POA: Diagnosis not present

## 2022-01-31 DIAGNOSIS — F17200 Nicotine dependence, unspecified, uncomplicated: Secondary | ICD-10-CM | POA: Insufficient documentation

## 2022-01-31 LAB — COMPREHENSIVE METABOLIC PANEL
ALT: 30 U/L (ref 0–44)
AST: 58 U/L — ABNORMAL HIGH (ref 15–41)
Albumin: 2.4 g/dL — ABNORMAL LOW (ref 3.5–5.0)
Alkaline Phosphatase: 108 U/L (ref 38–126)
Anion gap: 10 (ref 5–15)
BUN: 6 mg/dL — ABNORMAL LOW (ref 8–23)
CO2: 23 mmol/L (ref 22–32)
Calcium: 9 mg/dL (ref 8.9–10.3)
Chloride: 104 mmol/L (ref 98–111)
Creatinine, Ser: 0.84 mg/dL (ref 0.61–1.24)
GFR, Estimated: >60 mL/min (ref >60)
Glucose, Bld: 115 mg/dL — ABNORMAL HIGH (ref 70–99)
Potassium: 4.6 mmol/L (ref 3.5–5.1)
Sodium: 137 mmol/L (ref 135–145)
Total Bilirubin: 0.4 mg/dL (ref 0.3–1.2)
Total Protein: 5.9 g/dL — ABNORMAL LOW (ref 6.5–8.1)

## 2022-01-31 LAB — CBC
HCT: 32.5 % — ABNORMAL LOW (ref 39.0–52.0)
Hemoglobin: 11.2 g/dL — ABNORMAL LOW (ref 13.0–17.0)
MCH: 34.5 pg — ABNORMAL HIGH (ref 26.0–34.0)
MCHC: 34.5 g/dL (ref 30.0–36.0)
MCV: 100 fL (ref 80.0–100.0)
Platelets: 153 10*3/uL (ref 150–400)
RBC: 3.25 MIL/uL — ABNORMAL LOW (ref 4.22–5.81)
RDW: 13.3 % (ref 11.5–15.5)
WBC: 7.3 10*3/uL (ref 4.0–10.5)
nRBC: 0 % (ref 0.0–0.2)

## 2022-01-31 LAB — URINALYSIS, ROUTINE W REFLEX MICROSCOPIC
Bilirubin Urine: NEGATIVE
Glucose, UA: NEGATIVE mg/dL
Hgb urine dipstick: NEGATIVE
Ketones, ur: NEGATIVE mg/dL
Nitrite: POSITIVE — AB
Protein, ur: NEGATIVE mg/dL
Specific Gravity, Urine: 1.013 (ref 1.005–1.030)
pH: 6 (ref 5.0–8.0)

## 2022-01-31 LAB — BRAIN NATRIURETIC PEPTIDE: B Natriuretic Peptide: 95.3 pg/mL (ref 0.0–100.0)

## 2022-01-31 LAB — LIPASE, BLOOD: Lipase: 20 U/L (ref 11–51)

## 2022-01-31 MED ORDER — CIPROFLOXACIN HCL 500 MG PO TABS: 500.0000 mg | ORAL_TABLET | Freq: Two times a day (BID) | ORAL | 0 refills | Status: AC

## 2022-01-31 MED ORDER — RIVAROXABAN (XARELTO) VTE STARTER PACK (15 & 20 MG): ORAL_TABLET | ORAL | 0 refills | Status: AC

## 2022-01-31 MED ORDER — RIVAROXABAN 15 MG PO TABS
15.0000 mg | ORAL_TABLET | Freq: Once | ORAL | Status: AC
  Administered 2022-01-31: 15 mg via ORAL
  Filled 2022-01-31: qty 1

## 2022-01-31 MED ORDER — PANTOPRAZOLE SODIUM 40 MG PO TBEC: 40.0000 mg | DELAYED_RELEASE_TABLET | Freq: Every day | ORAL | 0 refills | Status: AC

## 2022-01-31 NOTE — Progress Notes (Signed)
Bilateral lower extremity venous duplex completed. Refer to "CV Proc" under chart review to view preliminary results.  Preliminary results discussed with Fatima Sanger, PA-C.  01/31/2022 4:42 PM Kelby Aline., MHA, RVT, RDCS, RDMS

## 2022-01-31 NOTE — Discharge Instructions (Addendum)
Please follow-up with your primary care provider.  Today you have been given a short supply of Xarelto to start getting the medication.  You will require at least 3 months of medication so you will need to follow-up with your primary care.  Do not drink on this medication, eating will increase your risk for falls, and head bleeds.  In addition blood in the stool, vomiting blood, and changes in vision or return symptoms.

## 2022-01-31 NOTE — ED Provider Triage Note (Signed)
Emergency Medicine Provider Triage Evaluation Note  Ethan Donovan , a 66 y.o. male  was evaluated in triage.  Pt complains of bilateral lower extremity swelling.  He states he went to [facility] ED on 01/21/2022 and was diagnosed with right lower extremity DVT.  States his blood thinner medication was $700 and he cannot afford it.  Has not been taking this.  States the swelling has slightly improved but is still there.  Has a tight feeling in his legs.  No chest pain or shortness of breath.  Review of Systems  Positive: As above Negative: Chest pain, shortness of breath, hemoptysis, syncope  Physical Exam  BP (!) 141/105 (BP Location: Right Arm)   Pulse 79   Temp 98.3 F (36.8 C) (Oral)   Resp 16   Ht 6' (1.829 m)   Wt 63.5 kg   SpO2 100%   BMI 18.99 kg/m  Gen:   Awake, no distress   Resp:  Normal effort  MSK:   Moves extremities without difficulty  Other:  Bilateral foot swelling.  DP pulses 2+ bilaterally  Medical Decision Making  Medically screening exam initiated at 9:18 AM.  Appropriate orders placed.  Ethan Donovan was informed that the remainder of the evaluation will be completed by another provider, this initial triage assessment does not replace that evaluation, and the importance of remaining in the ED until their evaluation is complete.  We will order basic labs, BNP, bilateral ultrasound, chest x-ray   Roylene Reason, PA-C 01/31/22 2355

## 2022-01-31 NOTE — ED Provider Notes (Signed)
[facility] EMERGENCY DEPARTMENT Provider Note   CSN: [id number] Arrival date & time: 01/31/22  8841     History  Chief Complaint  Patient presents with   Leg Swelling   Foot Swelling   Diarrhea    Ethan Donovan is a 66 y.o. male.,  No pertinent past medical history, who presents to the ED secondary to bilateral lower extremity swelling for the last week.  He states his right leg is more swollen than the left, is more tender.  He states that he has not had any fever, chills, rash on the leg.  Denies any recent trauma to the leg.  Is not on any blood thinners, has no history of A-fib.  Is a smoker.  Also reports diarrhea x3 months, that is loose, and brown in color.  He states he has 3-4 episodes of diarrhea a day.  Denies any abdominal pain, fevers, chills.  Endorses drinking 12 beers/day. Has increased urinary frequency.     Diarrhea Associated symptoms: no vomiting        Home Medications Prior to Admission medications   Medication Sig Start Date End Date Taking? Authorizing Provider  ciprofloxacin (CIPRO) 500 MG tablet Take 1 tablet (500 mg total) by mouth every 12 (twelve) hours. 01/31/22  Yes Olanna Percifield L, PA  pantoprazole (PROTONIX) 40 MG tablet Take 1 tablet (40 mg total) by mouth daily. 01/31/22  Yes Masiyah Engen L, PA  RIVAROXABAN (XARELTO) VTE STARTER PACK (15 & 20 MG) Follow package directions: Take one 15mg  tablet by mouth twice a day. On day 22, switch to one 20mg  tablet once a day. Take with food. 01/31/22  Yes Natlie Asfour L, PA  nicotine (NICODERM CQ - DOSED IN MG/24 HOURS) 21 mg/24hr patch Place 1 patch (21 mg total) onto the skin daily. 01/09/16   04/02/22, DO      Allergies    Patient has no known allergies.    Review of Systems   Review of Systems  Respiratory:  Negative for shortness of breath.   Cardiovascular:  Negative for chest pain.  Gastrointestinal:  Positive for diarrhea. Negative for nausea and vomiting.   Musculoskeletal:        +swelling of BLE   Skin:  Negative for rash.    Physical Exam Updated Vital Signs BP (!) 165/102   Pulse 69   Temp 98.7 F (37.1 C) (Oral)   Resp 15   Ht 6' (1.829 m)   Wt 63.5 kg   SpO2 97%   BMI 18.99 kg/m  Physical Exam Vitals and nursing note reviewed.  Constitutional:      General: He is not in acute distress.    Appearance: He is well-developed.  HENT:     Head: Normocephalic and atraumatic.  Eyes:     Conjunctiva/sclera: Conjunctivae normal.  Cardiovascular:     Rate and Rhythm: Normal rate and regular rhythm.     Pulses: Normal pulses.     Heart sounds: No murmur heard.    Comments: +TTP of venous system of RLE, behind popliteal Pulmonary:     Effort: Pulmonary effort is normal. No respiratory distress.     Breath sounds: Normal breath sounds.  Abdominal:     Palpations: Abdomen is soft.     Tenderness: There is no abdominal tenderness.  Musculoskeletal:        General: Swelling present.     Cervical back: Neck supple.     Right lower leg: 2+ Edema present.  Left lower leg: 1+ Edema present.  Skin:    General: Skin is warm and dry.     Capillary Refill: Capillary refill takes less than 2 seconds.  Neurological:     Mental Status: He is alert.  Psychiatric:        Mood and Affect: Mood normal.     ED Results / Procedures / Treatments   Labs (all labs ordered are listed, but only abnormal results are displayed) Labs Reviewed  COMPREHENSIVE METABOLIC PANEL - Abnormal; Notable for the following components:      Result Value   Glucose, Bld 115 (*)    BUN 6 (*)    Total Protein 5.9 (*)    Albumin 2.4 (*)    AST 58 (*)    All other components within normal limits  CBC - Abnormal; Notable for the following components:   RBC 3.25 (*)    Hemoglobin 11.2 (*)    HCT 32.5 (*)    MCH 34.5 (*)    All other components within normal limits  URINALYSIS, ROUTINE W REFLEX MICROSCOPIC - Abnormal; Notable for the following  components:   Color, Urine AMBER (*)    APPearance CLOUDY (*)    Nitrite POSITIVE (*)    Leukocytes,Ua Dilpreet Faires (*)    Bacteria, UA MANY (*)    All other components within normal limits  LIPASE, BLOOD  BRAIN NATRIURETIC PEPTIDE    EKG None  Radiology VAS US LOWER EXTREMITY VENOUS (DVT) (ONLY [facility] & [facility])  Result Date: 01/31/2022  Lower Venous DVT Study Patient Name:  Lillette BoxerBOBBY Schwalm  Date of Exam:   01/31/2022 Medical Rec #: [id number]     Accession #:    [id number] Date of Birth: 05/17/1955    Patient Gender: M Patient Age:   [age] years [facility] Procedure:      VAS US LOWER EXTREMITY VENOUS (DVT) Referring Phys: Lyn HollingsheadALEXANDER SCHUTT --------------------------------------------------------------------------------  Indications: Edema.  Limitations: Restricted mobility, position. Comparison Study: No prior study Performing Technologist: Gertie FeyMichelle Simonetti MHA, RDMS, RVT, RDCS  Examination Guidelines: A complete evaluation includes B-mode imaging, spectral Doppler, color Doppler, and power Doppler as needed of all accessible portions of each vessel. Bilateral testing is considered an integral part of a complete examination. Limited examinations for reoccurring indications may be performed as noted. The reflux portion of the exam is performed with the patient in reverse Trendelenburg.  +---------+---------------+---------+-----------+----------+--------------+ RIGHT    CompressibilityPhasicitySpontaneityPropertiesThrombus Aging +---------+---------------+---------+-----------+----------+--------------+ CFV      Full           Yes      Yes                                 +---------+---------------+---------+-----------+----------+--------------+ SFJ      Full                                                        +---------+---------------+---------+-----------+----------+--------------+ FV Prox  Full                                                         +---------+---------------+---------+-----------+----------+--------------+  FV Mid   Full                                                        +---------+---------------+---------+-----------+----------+--------------+ FV DistalFull                                                        +---------+---------------+---------+-----------+----------+--------------+ PFV      Full                                                        +---------+---------------+---------+-----------+----------+--------------+ POP      None                    No                   Acute          +---------+---------------+---------+-----------+----------+--------------+ PTV      None                    No                   Acute          +---------+---------------+---------+-----------+----------+--------------+ PERO     None                    No                   Acute          +---------+---------------+---------+-----------+----------+--------------+   +---------+---------------+---------+-----------+----------+--------------+ LEFT     CompressibilityPhasicitySpontaneityPropertiesThrombus Aging +---------+---------------+---------+-----------+----------+--------------+ CFV      Full           Yes      Yes                                 +---------+---------------+---------+-----------+----------+--------------+ SFJ      Full                                                        +---------+---------------+---------+-----------+----------+--------------+ FV Prox  Full                                                        +---------+---------------+---------+-----------+----------+--------------+ FV Mid   Full                                                        +---------+---------------+---------+-----------+----------+--------------+  FV DistalFull                                                         +---------+---------------+---------+-----------+----------+--------------+ PFV      Full                                                        +---------+---------------+---------+-----------+----------+--------------+ POP      Full           Yes      Yes                                 +---------+---------------+---------+-----------+----------+--------------+ PTV      Full                                                        +---------+---------------+---------+-----------+----------+--------------+ PERO     Full                                                        +---------+---------------+---------+-----------+----------+--------------+    Summary: RIGHT: - Findings consistent with acute deep vein thrombosis involving the right popliteal vein, right posterior tibial veins, and right peroneal veins. - No cystic structure found in the popliteal fossa.  LEFT: - There is no evidence of deep vein thrombosis in the lower extremity.  - No cystic structure found in the popliteal fossa.  *See table(s) above for measurements and observations.    Preliminary    DG Chest 2 View  Result Date: 01/31/2022 CLINICAL DATA:  Lower extremity swelling. EXAM: CHEST - 2 VIEW COMPARISON:  01/07/2016 FINDINGS: Lungs are hyperexpanded. The lungs are clear without focal pneumonia, edema, pneumothorax or pleural effusion. Basilar scarring again noted. The cardiopericardial silhouette is within normal limits for size. The visualized bony structures of the thorax are unremarkable. IMPRESSION: No active cardiopulmonary disease. Electronically Signed   By: Kennith Center M.D.   On: 01/31/2022 09:31    Procedures Procedures    Medications Ordered in ED Medications - No data to display  ED Course/ Medical Decision Making/ A&P                           Medical Decision Making Amount and/or Complexity of Data Reviewed Labs: ordered. Radiology: ordered.  Risk Prescription drug  management.   This patient presents to the ED for concern of swelling of BLE, diarrhea  Co morbidities that complicate the patient evaluation  Alcoholism   Additional history obtained:  Additional history obtained from family member at bedside--she states pt drinks 12 beers/day  Lab Tests:  I Ordered, and personally interpreted labs.  The pertinent results include:  +UA for nitrate and many bacteria   Imaging Studies ordered:  I ordered imaging studies including cxr and vascular u/s , CXR is unremarkable and U/S is concerning for DVT in RLE from popliteal    Test / Admission - Considered:  Patient is a 66 year old male, history of alcoholism, who presents to the ED secondary to bilateral lower extremity swelling, he is found to have a right lower extremity DVT, and UTI.  He started on ciprofloxacin for his UTI, and then Xarelto for his DVT, this appears to be unprovoked, but unclear at this time.  Advised to follow-up with primary care doctor.  He has no shortness of breath or chest discomfort.  He was also started on pantoprazole secondary to his alcohol use, increased risk of bleeding.  He was instructed to stop drinking as there are many risk especially associated with the Xarelto with drinking.  He understands that he has an increased risk for brain bleeds, esophageal varices bleeding, GI bleeds.  He voiced understanding, and understood that he should stop drinking.  Return precautions were emphasized, and he is instructed to follow-up with his primary care.  His diarrhea has been chronic, and got going, he is well-appearing on exam, and he has no abdominal tenderness, he states that he eats less than he drinks, and that may be causing diarrhea.  He was advised to follow-up with his primary care doctor.  Final Clinical Impression(s) / ED Diagnoses Final diagnoses:  Acute deep vein thrombosis (DVT) of distal vein of right lower extremity (HCC)  Acute cystitis without hematuria     Rx / DC Orders ED Discharge Orders          Ordered    RIVAROXABAN (XARELTO) VTE STARTER PACK (15 & 20 MG)        01/31/22 1651    pantoprazole (PROTONIX) 40 MG tablet  Daily        01/31/22 1651    ciprofloxacin (CIPRO) 500 MG tablet  Every 12 hours        01/31/22 1651              Albion Weatherholtz, [place], PA 01/31/22 1829    Tegeler, Gwenyth Allegra, MD 02/01/22 0000

## 2022-01-31 NOTE — ED Triage Notes (Signed)
Friend stated, he has swollen feet and legs and diarrhea unable to hold it. He has on a diaper now because he can not hold it. He drinks a 12 pack a day. He has drank all his life.

## 2022-06-30 DEATH — deceased
